# Patient Record
Sex: Female | Born: 1975 | Race: White | Hispanic: No | Marital: Married | Smoking: Former smoker
Health system: Southern US, Community
[De-identification: ages and names within clinical notes are randomized; demographics above are authoritative.]

## PROBLEM LIST (undated history)

## (undated) DIAGNOSIS — I10 Essential (primary) hypertension: Secondary | ICD-10-CM

## (undated) DIAGNOSIS — E785 Hyperlipidemia, unspecified: Secondary | ICD-10-CM

## (undated) HISTORY — DX: Essential (primary) hypertension: I10

## (undated) HISTORY — DX: Hyperlipidemia, unspecified: E78.5

---

## 2005-01-04 DIAGNOSIS — C801 Malignant (primary) neoplasm, unspecified: Secondary | ICD-10-CM

## 2005-01-04 HISTORY — DX: Malignant (primary) neoplasm, unspecified: C80.1

## 2008-04-24 ENCOUNTER — Ambulatory Visit (HOSPITAL_COMMUNITY): Admission: RE | Admit: 2008-04-24 | Discharge: 2008-04-24 | Payer: Self-pay | Admitting: Obstetrics and Gynecology

## 2008-04-25 ENCOUNTER — Encounter (INDEPENDENT_AMBULATORY_CARE_PROVIDER_SITE_OTHER): Payer: Self-pay | Admitting: Obstetrics and Gynecology

## 2009-01-29 ENCOUNTER — Inpatient Hospital Stay (HOSPITAL_COMMUNITY): Admission: AD | Admit: 2009-01-29 | Discharge: 2009-01-29 | Payer: Self-pay | Admitting: Obstetrics and Gynecology

## 2009-01-31 ENCOUNTER — Inpatient Hospital Stay (HOSPITAL_COMMUNITY): Admission: AD | Admit: 2009-01-31 | Discharge: 2009-01-31 | Payer: Self-pay | Admitting: Obstetrics and Gynecology

## 2009-02-02 ENCOUNTER — Inpatient Hospital Stay (HOSPITAL_COMMUNITY): Admission: AD | Admit: 2009-02-02 | Discharge: 2009-02-02 | Payer: Self-pay | Admitting: Obstetrics & Gynecology

## 2009-02-04 ENCOUNTER — Inpatient Hospital Stay (HOSPITAL_COMMUNITY): Admission: AD | Admit: 2009-02-04 | Discharge: 2009-02-09 | Payer: Self-pay | Admitting: Obstetrics & Gynecology

## 2009-02-04 ENCOUNTER — Encounter (INDEPENDENT_AMBULATORY_CARE_PROVIDER_SITE_OTHER): Payer: Self-pay | Admitting: Obstetrics & Gynecology

## 2009-02-10 ENCOUNTER — Encounter: Admission: RE | Admit: 2009-02-10 | Discharge: 2009-03-08 | Payer: Self-pay | Admitting: Obstetrics and Gynecology

## 2009-02-22 ENCOUNTER — Inpatient Hospital Stay (HOSPITAL_COMMUNITY): Admission: AD | Admit: 2009-02-22 | Discharge: 2009-02-22 | Payer: Self-pay | Admitting: Obstetrics and Gynecology

## 2009-02-23 ENCOUNTER — Inpatient Hospital Stay (HOSPITAL_COMMUNITY): Admission: AD | Admit: 2009-02-23 | Discharge: 2009-02-23 | Payer: Self-pay | Admitting: Obstetrics and Gynecology

## 2009-02-24 ENCOUNTER — Inpatient Hospital Stay (HOSPITAL_COMMUNITY): Admission: AD | Admit: 2009-02-24 | Discharge: 2009-02-24 | Payer: Self-pay | Admitting: Obstetrics and Gynecology

## 2009-02-25 ENCOUNTER — Inpatient Hospital Stay (HOSPITAL_COMMUNITY): Admission: AD | Admit: 2009-02-25 | Discharge: 2009-02-25 | Payer: Self-pay | Admitting: Obstetrics and Gynecology

## 2009-03-01 ENCOUNTER — Inpatient Hospital Stay (HOSPITAL_COMMUNITY): Admission: AD | Admit: 2009-03-01 | Discharge: 2009-03-01 | Payer: Self-pay | Admitting: Obstetrics and Gynecology

## 2009-03-02 ENCOUNTER — Ambulatory Visit: Payer: Self-pay | Admitting: Obstetrics and Gynecology

## 2009-03-02 ENCOUNTER — Inpatient Hospital Stay (HOSPITAL_COMMUNITY): Admission: AD | Admit: 2009-03-02 | Discharge: 2009-03-02 | Payer: Self-pay | Admitting: Obstetrics and Gynecology

## 2009-03-03 ENCOUNTER — Inpatient Hospital Stay (HOSPITAL_COMMUNITY): Admission: AD | Admit: 2009-03-03 | Discharge: 2009-03-03 | Payer: Self-pay | Admitting: Obstetrics and Gynecology

## 2009-10-22 ENCOUNTER — Ambulatory Visit (HOSPITAL_COMMUNITY): Admission: RE | Admit: 2009-10-22 | Discharge: 2009-10-22 | Payer: Self-pay | Admitting: Obstetrics and Gynecology

## 2010-01-03 ENCOUNTER — Inpatient Hospital Stay (HOSPITAL_COMMUNITY): Admission: AD | Admit: 2010-01-03 | Discharge: 2010-01-07 | Payer: Self-pay | Admitting: General Surgery

## 2011-02-16 LAB — DIFFERENTIAL
Basophils Absolute: 0.1 10*3/uL (ref 0.0–0.1)
Eosinophils Relative: 2 % (ref 0–5)
Lymphocytes Relative: 31 % (ref 12–46)
Lymphs Abs: 1.6 10*3/uL (ref 0.7–4.0)
Monocytes Absolute: 0.3 10*3/uL (ref 0.1–1.0)
Monocytes Relative: 7 % (ref 3–12)

## 2011-02-16 LAB — COMPREHENSIVE METABOLIC PANEL
AST: 22 U/L (ref 0–37)
Albumin: 4 g/dL (ref 3.5–5.2)
Chloride: 103 mEq/L (ref 96–112)
Creatinine, Ser: 0.94 mg/dL (ref 0.4–1.2)
GFR calc Af Amer: >60 mL/min (ref >60)
Total Bilirubin: 1.5 mg/dL — ABNORMAL HIGH (ref 0.3–1.2)
Total Protein: 7 g/dL (ref 6.0–8.3)

## 2011-02-16 LAB — CBC
MCV: 94 fL (ref 78.0–100.0)
Platelets: 263 10*3/uL (ref 150–400)
RDW: 12.9 % (ref 11.5–15.5)
WBC: 5.2 10*3/uL (ref 4.0–10.5)

## 2011-02-16 LAB — URINALYSIS, ROUTINE W REFLEX MICROSCOPIC
Bilirubin Urine: NEGATIVE
Glucose, UA: NEGATIVE mg/dL
Hgb urine dipstick: NEGATIVE
Specific Gravity, Urine: 1.029 (ref 1.005–1.030)
pH: 6 (ref 5.0–8.0)

## 2011-03-12 LAB — CBC
HCT: 29.9 % — ABNORMAL LOW (ref 36.0–46.0)
HCT: 32.6 % — ABNORMAL LOW (ref 36.0–46.0)
HCT: 33.9 % — ABNORMAL LOW (ref 36.0–46.0)
HCT: 37.4 % (ref 36.0–46.0)
Hemoglobin: 10 g/dL — ABNORMAL LOW (ref 12.0–15.0)
Hemoglobin: 11.3 g/dL — ABNORMAL LOW (ref 12.0–15.0)
Hemoglobin: 11.4 g/dL — ABNORMAL LOW (ref 12.0–15.0)
Hemoglobin: 12.6 g/dL (ref 12.0–15.0)
MCHC: 33.4 g/dL (ref 30.0–36.0)
MCHC: 33.5 g/dL (ref 30.0–36.0)
MCHC: 33.7 g/dL (ref 30.0–36.0)
MCHC: 33.8 g/dL (ref 30.0–36.0)
MCV: 91.5 fL (ref 78.0–100.0)
MCV: 91.7 fL (ref 78.0–100.0)
MCV: 91.8 fL (ref 78.0–100.0)
MCV: 92.2 fL (ref 78.0–100.0)
MCV: 92.4 fL (ref 78.0–100.0)
MCV: 92.6 fL (ref 78.0–100.0)
MCV: 92.7 fL (ref 78.0–100.0)
Platelets: 135 10*3/uL — ABNORMAL LOW (ref 150–400)
Platelets: 144 10*3/uL — ABNORMAL LOW (ref 150–400)
Platelets: 157 10*3/uL (ref 150–400)
RBC: 3.14 MIL/uL — ABNORMAL LOW (ref 3.87–5.11)
RBC: 3.23 MIL/uL — ABNORMAL LOW (ref 3.87–5.11)
RBC: 3.65 MIL/uL — ABNORMAL LOW (ref 3.87–5.11)
RBC: 3.69 MIL/uL — ABNORMAL LOW (ref 3.87–5.11)
RBC: 3.81 MIL/uL — ABNORMAL LOW (ref 3.87–5.11)
RDW: 14.4 % (ref 11.5–15.5)
RDW: 14.5 % (ref 11.5–15.5)
RDW: 14.6 % (ref 11.5–15.5)
RDW: 14.8 % (ref 11.5–15.5)
WBC: 10.3 10*3/uL (ref 4.0–10.5)
WBC: 7.1 10*3/uL (ref 4.0–10.5)
WBC: 7.4 10*3/uL (ref 4.0–10.5)
WBC: 7.9 10*3/uL (ref 4.0–10.5)
WBC: 8 10*3/uL (ref 4.0–10.5)
WBC: 8.8 10*3/uL (ref 4.0–10.5)

## 2011-03-12 LAB — COMPREHENSIVE METABOLIC PANEL
ALT: 101 U/L — ABNORMAL HIGH (ref 0–35)
ALT: 17 U/L (ref 0–35)
ALT: 19 U/L (ref 0–35)
AST: 23 U/L (ref 0–37)
AST: 24 U/L (ref 0–37)
AST: 25 U/L (ref 0–37)
AST: 29 U/L (ref 0–37)
AST: 82 U/L — ABNORMAL HIGH (ref 0–37)
Albumin: 2 g/dL — ABNORMAL LOW (ref 3.5–5.2)
Albumin: 2.4 g/dL — ABNORMAL LOW (ref 3.5–5.2)
Alkaline Phosphatase: 65 U/L (ref 39–117)
BUN: 14 mg/dL (ref 6–23)
BUN: 9 mg/dL (ref 6–23)
BUN: 9 mg/dL (ref 6–23)
CO2: 22 mEq/L (ref 19–32)
CO2: 22 mEq/L (ref 19–32)
CO2: 22 mEq/L (ref 19–32)
CO2: 24 mEq/L (ref 19–32)
CO2: 25 mEq/L (ref 19–32)
Calcium: 8 mg/dL — ABNORMAL LOW (ref 8.4–10.5)
Calcium: 8.8 mg/dL (ref 8.4–10.5)
Calcium: 8.9 mg/dL (ref 8.4–10.5)
Calcium: 9.1 mg/dL (ref 8.4–10.5)
Calcium: 9.3 mg/dL (ref 8.4–10.5)
Chloride: 102 mEq/L (ref 96–112)
Chloride: 103 mEq/L (ref 96–112)
Chloride: 105 mEq/L (ref 96–112)
Chloride: 105 mEq/L (ref 96–112)
Chloride: 105 mEq/L (ref 96–112)
Creatinine, Ser: 0.76 mg/dL (ref 0.4–1.2)
Creatinine, Ser: 0.78 mg/dL (ref 0.4–1.2)
Creatinine, Ser: 0.83 mg/dL (ref 0.4–1.2)
Creatinine, Ser: 0.83 mg/dL (ref 0.4–1.2)
Creatinine, Ser: 0.84 mg/dL (ref 0.4–1.2)
Creatinine, Ser: 0.88 mg/dL (ref 0.4–1.2)
GFR calc Af Amer: >60 mL/min (ref >60)
GFR calc Af Amer: >60 mL/min (ref >60)
GFR calc Af Amer: >60 mL/min (ref >60)
GFR calc Af Amer: >60 mL/min (ref >60)
GFR calc non Af Amer: >60 mL/min (ref >60)
GFR calc non Af Amer: >60 mL/min (ref >60)
GFR calc non Af Amer: >60 mL/min (ref >60)
GFR calc non Af Amer: >60 mL/min (ref >60)
GFR calc non Af Amer: >60 mL/min (ref >60)
Glucose, Bld: 116 mg/dL — ABNORMAL HIGH (ref 70–99)
Glucose, Bld: 69 mg/dL — ABNORMAL LOW (ref 70–99)
Glucose, Bld: 93 mg/dL (ref 70–99)
Glucose, Bld: 93 mg/dL (ref 70–99)
Potassium: 4.3 mEq/L (ref 3.5–5.1)
Sodium: 133 mEq/L — ABNORMAL LOW (ref 135–145)
Sodium: 138 mEq/L (ref 135–145)
Total Bilirubin: 0.4 mg/dL (ref 0.3–1.2)
Total Bilirubin: 0.5 mg/dL (ref 0.3–1.2)
Total Bilirubin: 0.6 mg/dL (ref 0.3–1.2)
Total Bilirubin: 0.6 mg/dL (ref 0.3–1.2)
Total Bilirubin: 0.8 mg/dL (ref 0.3–1.2)
Total Protein: 4.4 g/dL — ABNORMAL LOW (ref 6.0–8.3)
Total Protein: 4.9 g/dL — ABNORMAL LOW (ref 6.0–8.3)
Total Protein: 5.4 g/dL — ABNORMAL LOW (ref 6.0–8.3)

## 2011-03-12 LAB — URINALYSIS, ROUTINE W REFLEX MICROSCOPIC
Glucose, UA: NEGATIVE mg/dL
Glucose, UA: NEGATIVE mg/dL
Hgb urine dipstick: NEGATIVE
Hgb urine dipstick: NEGATIVE
Ketones, ur: NEGATIVE mg/dL
Leukocytes, UA: NEGATIVE
Leukocytes, UA: NEGATIVE
Nitrite: NEGATIVE
Protein, ur: 100 mg/dL — AB
Protein, ur: 100 mg/dL — AB
Specific Gravity, Urine: 1.025 (ref 1.005–1.030)
Urobilinogen, UA: 0.2 mg/dL (ref 0.0–1.0)
Urobilinogen, UA: 0.2 mg/dL (ref 0.0–1.0)
pH: 6.5 (ref 5.0–8.0)

## 2011-03-12 LAB — URINE MICROSCOPIC-ADD ON

## 2011-03-12 LAB — LACTATE DEHYDROGENASE
LDH: 132 U/L (ref 94–250)
LDH: 165 U/L (ref 94–250)
LDH: 172 U/L (ref 94–250)
LDH: 178 U/L (ref 94–250)
LDH: 195 U/L (ref 94–250)
LDH: 217 U/L (ref 94–250)

## 2011-03-12 LAB — GLUCOSE, CAPILLARY: Glucose-Capillary: 94 mg/dL (ref 70–99)

## 2011-03-12 LAB — RPR: RPR Ser Ql: NONREACTIVE

## 2011-03-12 LAB — URIC ACID
Uric Acid, Serum: 6.2 mg/dL (ref 2.4–7.0)
Uric Acid, Serum: 6.5 mg/dL (ref 2.4–7.0)

## 2011-04-14 NOTE — Discharge Summary (Signed)
NAMEMARNY, SMETHERS                ACCOUNT NO.:  [id number]   MEDICAL RECORD NO.:  [id number]          PATIENT TYPE:  INP   LOCATION:  9306                          FACILITY:  [facility]   PHYSICIAN:  Guy Sandifer. Henderson Cloud, M.D. DATE OF BIRTH:  February 05, 1976   DATE OF ADMISSION:  02/04/2009  DATE OF DISCHARGE:  02/09/2009                               DISCHARGE SUMMARY   ADMITTING DIAGNOSES:  1. Anterior pregnancy at 82 and one-half weeks' estimated gestational      age.  2. Chronic hypertension.  3. Eclampsia.   DISCHARGE DIAGNOSES:  1. Anterior pregnancy at 50 and one-half weeks' estimated gestational      age.  2. Chronic hypertension.  3. Eclampsia.   PROCEDURE:  On February 04, 2009, repeat cesarean section.   REASON FOR ADMISSION:  This patient is a 35 year old white female G3, P1  at 25 and one-half weeks, whose prenatal care has been complicated by  hypertension.  She is on labetalol and Aldomet.  She had been seen in a  hospital in [place] on January 28, 2009, when she was admitted for blood  pressure management.  She was then seen in our office on January 29, 2009,  where she had laboratories done, BPP and nonstress test in the MAU,  which was normal.  She presented to [facility] on February 03, 2009,  with a complaint of elevated blood pressure.  Labs again were normal and  fetal heart tones were reactive.  She was sent home for followup in the  office this week.  She presented again on the morning of admission  complaining of headache and elevated blood pressure.   Laboratory work up was essentially normal and blood pressure improved  with rest.  She then developed tonic-clonic seizure activity.   HOSPITAL COURSE:  The patient was admitted to the hospital.  She was  treated with intravenous labetalol for blood pressures.  She was given  10 mg of Valium IV and was given magnesium sulfate intravenously.  She  was postictal, but stable.  She underwent head CT, which was negative  for  CVA.  She was then taken to operating room, where she underwent  repeat cesarean section, delivery of a viable female infant, Apgars of 8  and 9 with pH of 7.26.  Postoperatively, she was continued on magnesium  sulfate.  Laboratories remained okay.  Blood pressures were 160s/100s-  90s.  She was continued on her blood pressure medicines.  Her Aldomet  was increased from 250 mg t.i.d. to 500 mg t.i.d..  Her labetalol was  increased from 200 mg t.i.d. to 300 mg t.i.d.  On February 06, 2009, she  was given intravenous Lasix to help with diuresis for edema.  Magnesium  sulfate was discontinued on February 05, 2009.  She had continued to be  observed for stability as well as for blood pressure control.  On the  day of discharge, she is feeling good.  Blood pressures were improved in  the 130s-140s/80s.  Her vertical incision is healing well.  Staples  remain in place.  She is discharged  home in good condition.  She will  call for problems including not limited to temperature of 101 degrees,  increasing pain, persistent nausea, vomiting, heavy bleeding, headache,  or blurry vision.   MEDICATIONS:  1. Labetalol 300 mg, #90, one p.o. t.i.d. two refills.  2. Aldomet 500 mg, #90, one p.o. t.i.d. two refills.  3. Percocet 5/325, #40, one to two p.o. q.6 h. p.r.n.  4. Ibuprofen 600 mg q.6 h. p.r.n.  5. Vitamin daily.   Follow up is in the office in 2-4 days.       Guy Sandifer Henderson Cloud, M.D.  Electronically Signed     JET/MEDQ  D:  02/09/2009  T:  02/09/2009  Job:  [id number]

## 2011-04-14 NOTE — Op Note (Signed)
NAMEMarland Moss  KEARI, MIU                ACCOUNT NO.:  [id number]   MEDICAL RECORD NO.:  [id number]          PATIENT TYPE:  INP   LOCATION:  9371                          [facility]ACILITY:  [facility]   PHYSICIAN:  Freddy Finner, M.D.   DATE OF BIRTH:  Nov 29, 1976   DATE OF PROCEDURE:  02/04/2009  DATE OF DISCHARGE:                               OPERATIVE REPORT   PREOPERATIVE DIAGNOSIS:  Intrauterine pregnancy at 32-1/[redacted] weeks  gestation, eclampsia.   POSTOPERATIVE DIAGNOSIS:  Intrauterine pregnancy at 32-1/[redacted] weeks  gestation, eclampsia.   OPERATION:  Repeat cesarean section.   SURGEON:  Freddy Finner, M.D.   ANESTHESIA:  Spinal.   ESTIMATED BLOOD LOSS:  Intraoperatively 600 cc.   COMPLICATIONS:  Intraoperatively none.   SECONDARY DIAGNOSES:  Extensive scarring of the abdominal wall and  omental adhesions.   FINDINGS:  Delivered viable female infant, Apgars of 8 and 9 assigned by  the NICU team in attendance.  Arterial cord pH 7.26.   The patient is a 35 year old gravida 3, para 1, whose first pregnancy  delivered in 2002.  The current pregnancy is by a new husband.  The  patient is known to have chronic hypertension with increasing  hypertension to pregnancy and gestational diabetes for which she takes  glyburide.  She has been seen for evaluation at least 3 times in the  last week and was recently started on labetalol in the office on January 29, 2009, for her hypertension.  On that day, she also had a pH  evaluation and non-stress test and biophysical profile, all of which  were normal.  She presented for the second time in the last 24 hours  early a.m., approximately 4 a.m., with complaints of increased blood  pressure and at this time was having a headache.  Numbers have been  obtained which were normal within the last 3 hours. Her pressure  improved in the lateral decubitus position.  She was given medications  for her headache and immediately after receiving that medication, had a  tonic-clonic seizure.  Dr. Shawnie Pons with the teaching service was in the  house and attended the patient until my arrival, starting her on  magnesium sulfate 2 grams IV, 2 grams per hour.  She had a second  seizure immediately prior to my arrival.  She was postictal at that time  but I did give her 10 mg of Valium IV.  She had a CT scan which revealed  encephalopathy but no stroke or intracerebral bleeding.  Her sensorium  proved and she was alert and cooperative.  It was elected to proceed  with cesarean delivery.   She was taken to the operating room and there placed under adequate  spinal anesthesia.  She was given 900 mg of clindamycin IV.  She was  placed in the dorsal recumbent position with elevation of the right hip  about 15 degrees.  Her abdomen was prepped and draped in the usual  fashion.  Because of her massive obesity, straps were applied to hold  the abdomen to the anesthesia screen. An old lower midline incision  was  used and the incision made through the old scar and carried sharply down  to the fascia.  The fascia was entered sharply and extended the extent  of the skin incision.  The peritoneum was entered with significant  omental adhesions which were lysed, allowing access to the uterus and to  the peritoneal cavity.  Some scarring of the peritoneum on the lower  segment was dissected free.  An incision was made above the bladder  reflection and extended vertically in the midline.  With careful blunt  and sharp dissection, the membrane was revealed, ruptured and the  incision was extended manually.  The infant was then delivered without  significant difficulty in a vertex presentation.  Apgars and pH were  noted above.  Cord blood was obtained for both venous and arterial  samples.  The uterus was delivered onto the anterior abdominal wall. The  placenta was removed and the uterus carefully manually explored to  confirm complete evacuation of parts of conception.  The  placenta was  submitted for pathologic examination.  The uterine incision was closed  basically vertically.  The small transverse portion of the incision was  included in the vertical closure.  This was done in two layers with a  running locking 0 Monocryl for the first layer and a running locking 0  Monocryl for the second layer.  A figure-of-8 was required at the  midportion of the incision on the left side for complete hemostasis.  The uterus was delivered back into the abdominal cavity.  Irrigation was  carried out.  Hemostasis was complete.  Pack, needle and instruments  counts were correct.  The abdominal incision was closed in layers,  looped 0 PDS was used to close the fascia, running from superiorly to  inferiorly.  Subcutaneous tissue was approximated with interrupted 2-0  plains, skin was closed with skin staples.  The patient tolerated the  operative procedure well and was taken to the recovery room in good  condition.      Freddy Finner, M.D.  Electronically Signed     WRN/MEDQ  D:  02/04/2009  T:  02/04/2009  Job:  [id number]

## 2011-04-14 NOTE — Op Note (Signed)
Annette Moss, Annette Moss                ACCOUNT NO.:  [id number]   MEDICAL RECORD NO.:  [id number]          PATIENT TYPE:  AMB   LOCATION:  SDC                           [facility]:  [facility]   PHYSICIAN:  Michelle L. Grewal, M.D.DATE OF BIRTH:  07/14/1976   DATE OF PROCEDURE:  04/24/2008  DATE OF DISCHARGE:                               OPERATIVE REPORT   PREOPERATIVE DIAGNOSIS:  Incomplete abortion.   POSTOPERATIVE DIAGNOSIS:  Incomplete abortion.   PROCEDURE:  Dilatation and evacuation.   SURGEON:  Michelle L. Vincente Poli, MD   ANESTHESIA:  MAC with local.   SPECIMENS:  Products of conception, sent to pathology.   ESTIMATED BLOOD LOSS:  Less than 50 mL.   COMPLICATIONS:  None.   PROCEDURE:  The patient was taken to the operating room, after informed  consent was obtained.  She was prepped and draped in sterile fashion.  A  speculum was placed in the vagina.  The cervix was grasped with a  tenaculum.  Her cervix was already partially opened and there was a  moderate amount of blood in the vagina.  A paracervical block was  performed in standard fashion.  The cervix was dilated a little bit  further with a Pratt dilator, and a #7 suction cannula was easily  inserted to the uterus and the uterus was thoroughly suctioned x2 with  products of conception retrieved.  The cannula was removed.  A sharp  curette was inserted and the uterus was thoroughly curetted of all  tissue and the tissue did appear to be consistent with products of  conception.  A final suction curettage was performed, and the uterine  cavity was cleaned.  All instruments were removed from the vagina.  All  sponge, lap, and instrument counts were correct x2.  The patient went to  recovery room in stable condition.      Michelle L. Vincente Poli, M.D.  Electronically Signed     MLG/MEDQ  D:  04/24/2008  T:  04/25/2008  Job:  [id number]

## 2011-08-26 LAB — CBC
HCT: 37.3
MCHC: 34.5
MCV: 91.8
RBC: 4.06
WBC: 7.9

## 2011-08-30 IMAGING — CT CT ABDOMEN W/ CM
1 of 2 series · 15 of 32 positions shown, 19 images · IV contrast (agent unspecified)
Comparison: None

CT ABDOMEN

CLINICAL DATA: Right lower quadrant abdominal mass.  Evaluate for
mass or hernia.  Previous cesarean sections and left oophrectomy.

CT ABDOMEN AND PELVIS WITH CONTRAST
TECHNIQUE: Multidetector CT imaging of the abdomen and pelvis was
performed using the standard protocol following bolus
administration of intravenous contrast.
Contrast: 100 ml Rmnipaque-F44 and oral contrast

[Series 2: routine abdomen/pelvis with · axial · 0.98mm/px · z∈[-486,-30]mm · 15 of 101 slices shown, 19 images]
[im 5/101  soft-tissue]
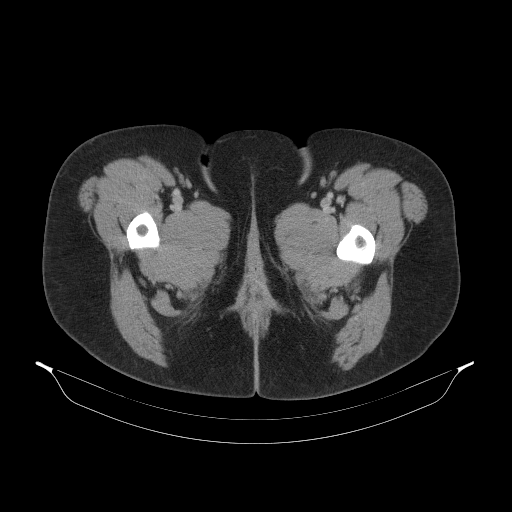
[im 5/101  bone]
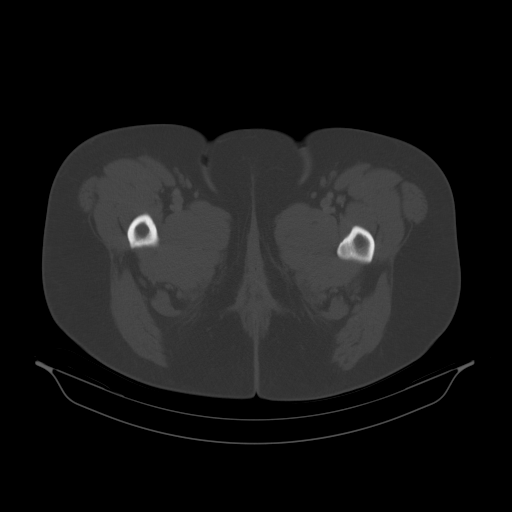
[im 13/101  soft-tissue]
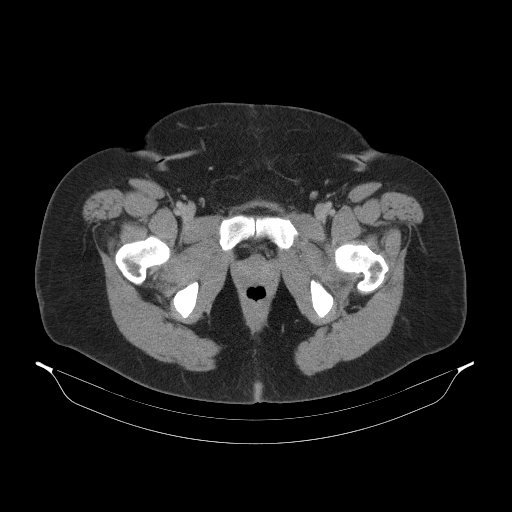
[im 21/101  soft-tissue]
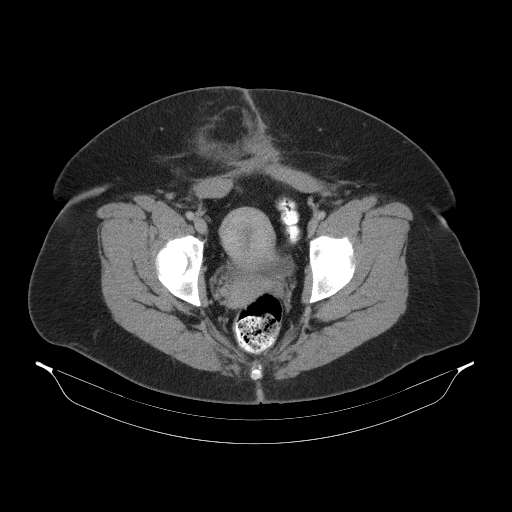
[im 30/101  soft-tissue]
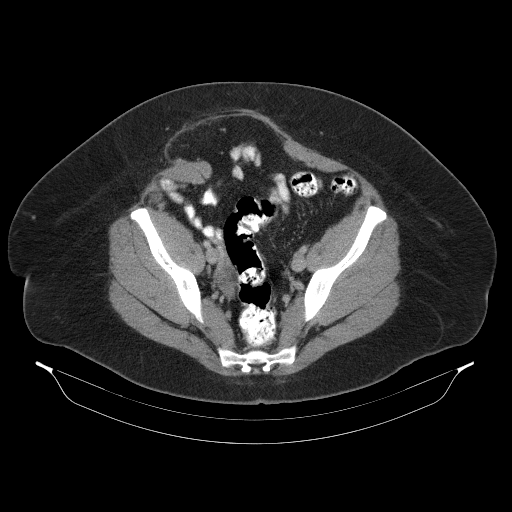
[im 34/101  soft-tissue]
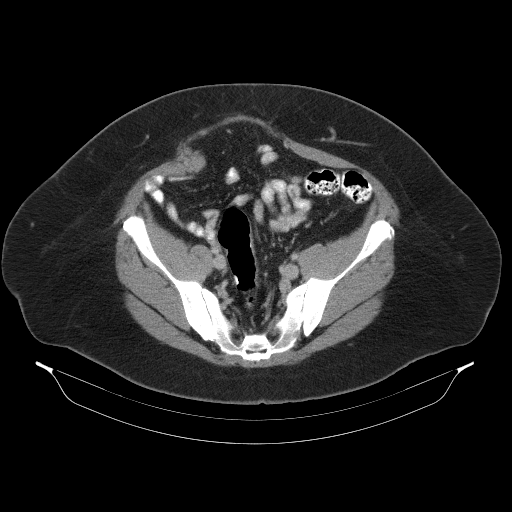
[im 42/101  soft-tissue]
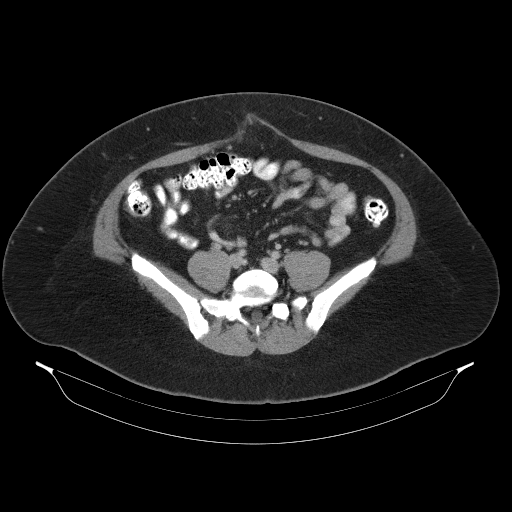
[im 51/101  soft-tissue]
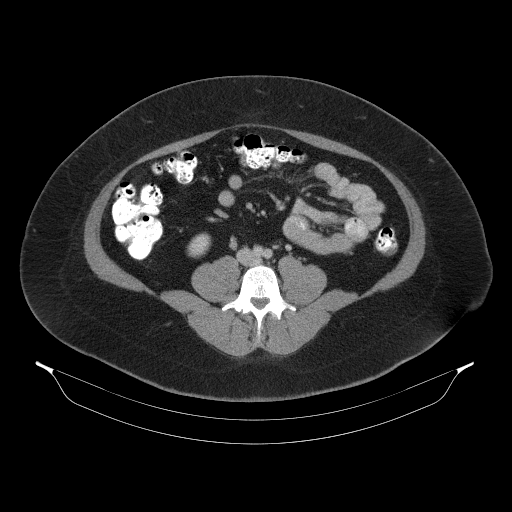
[im 59/101  soft-tissue]
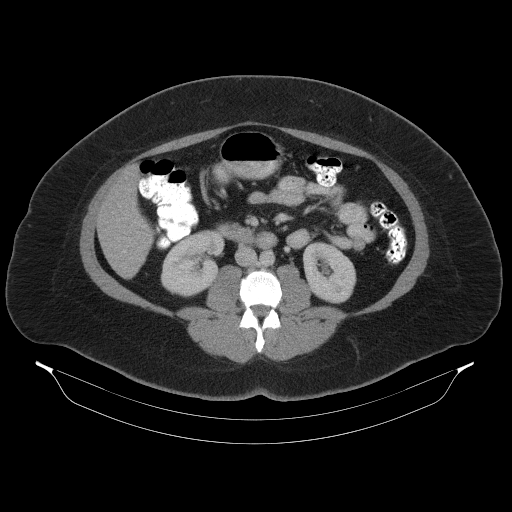
[im 67/101  soft-tissue]
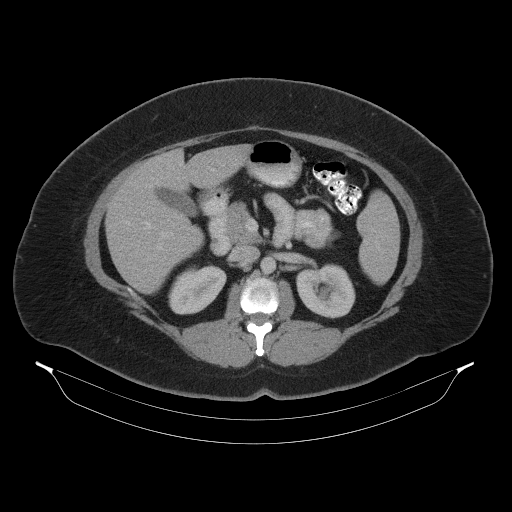
[im 67/101  bone]
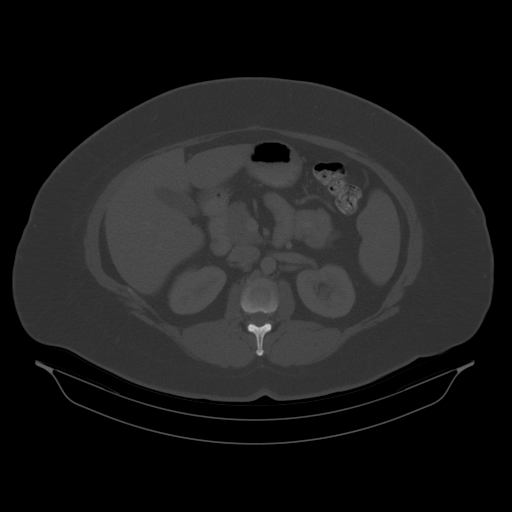
[im 71/101  soft-tissue]
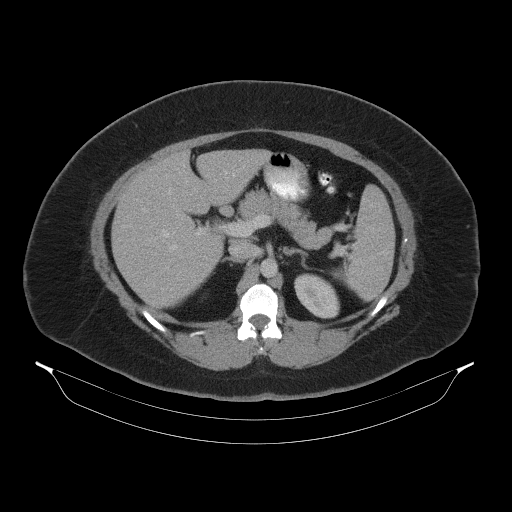
[im 80/101  soft-tissue]
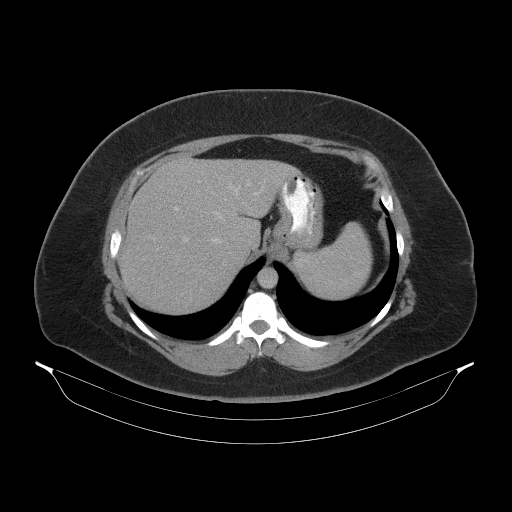
[im 84/101  lung]
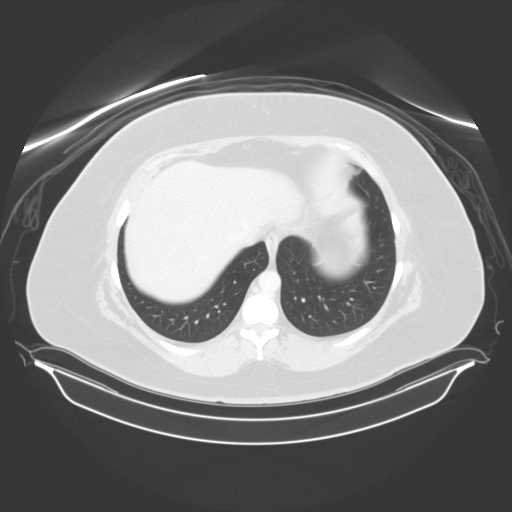
[im 88/101  soft-tissue]
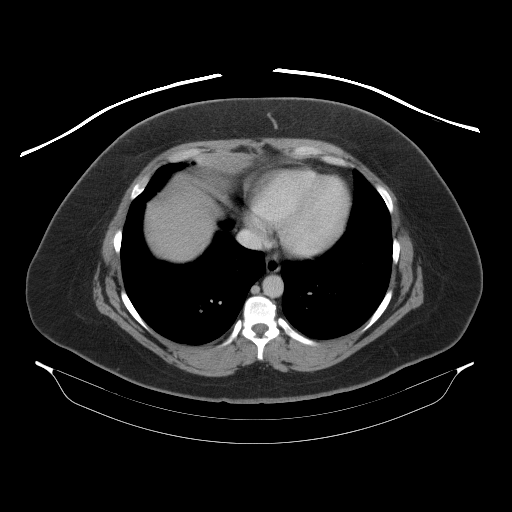
[im 88/101  lung]
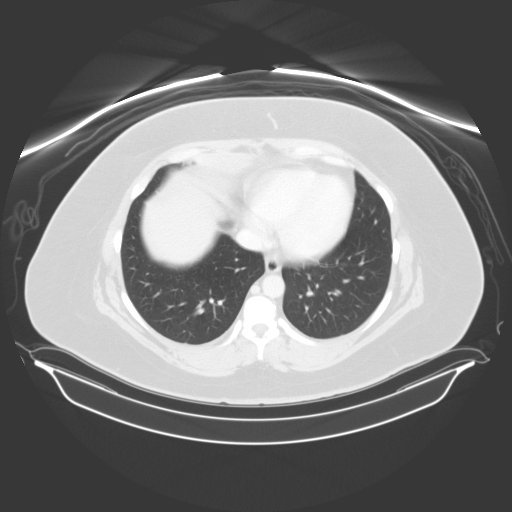
[im 92/101  lung]
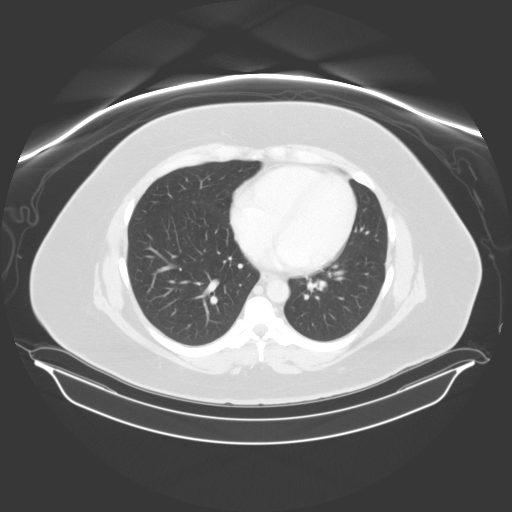
[im 96/101  soft-tissue]
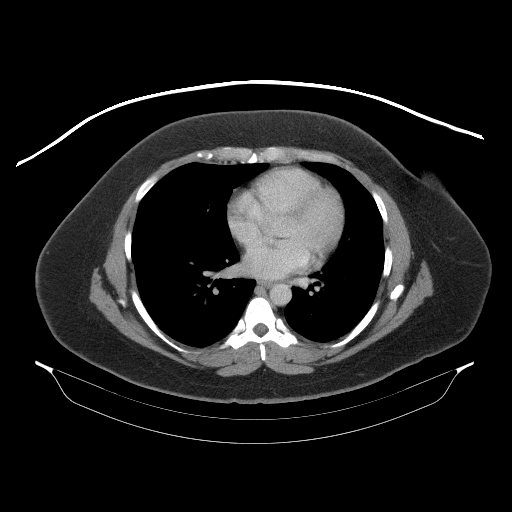
[im 96/101  lung]
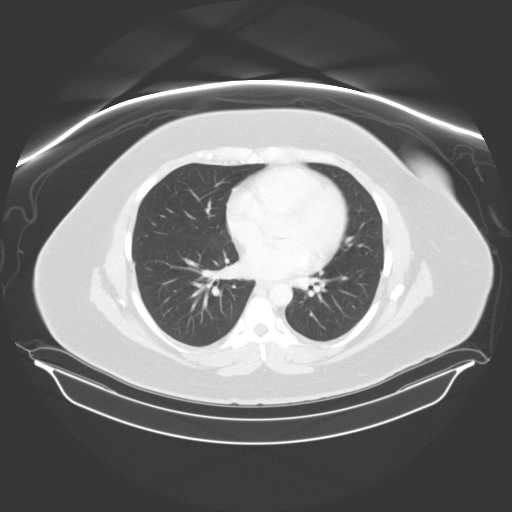

[15 of 32 positions shown; findings below may reference images not displayed]

FINDINGS: The abdominal parenchymal organs are normal in
appearance.  No soft tissue masses or adenopathy identified.

There is no evidence of inflammatory process or abnormal fluid
collections.  Gallbladder is unremarkable appearance there is no
evidence of hydronephrosis.  Abdominal bowel loops are also normal
in appearance.  No abdominal wall mass identified.
IMPRESSION: Negative abdomen CT.  No evidence of mass or hernia and upper
abdomen.

CT PELVIS
FINDINGS: A small periumbilical intra-abdominal wall hernia is
seen containing only omental fat.

There is a second midline infraumbilical abdominal wall hernia
containing a large amount of fat and a single loop of small bowel.
There is no evidence of bowel ischemia or obstruction.

Uterus and adnexa are unremarkable appearance.  No pelvic soft
tissue masses or adenopathy identified.  There is no evidence of
inflammatory process or abnormal fluid collections.
IMPRESSION: 1.  Large midline infraumbilical abdominal wall hernia containing
mostly fat and a single loop of small bowel.
2.  Small periumbilical abdominal wall hernia containing only
omental fat.
3.  No evidence of pelvic soft tissue mass or other significant
abnormality.

## 2012-04-19 ENCOUNTER — Other Ambulatory Visit: Payer: Self-pay | Admitting: Obstetrics and Gynecology

## 2013-04-20 ENCOUNTER — Other Ambulatory Visit: Payer: Self-pay | Admitting: Obstetrics and Gynecology

## 2014-06-08 ENCOUNTER — Other Ambulatory Visit: Payer: Self-pay | Admitting: Obstetrics and Gynecology

## 2014-06-11 LAB — CYTOLOGY - PAP

## 2015-06-26 ENCOUNTER — Other Ambulatory Visit: Payer: Self-pay | Admitting: Obstetrics and Gynecology

## 2015-06-28 LAB — CYTOLOGY - PAP

## 2021-11-07 ENCOUNTER — Other Ambulatory Visit: Payer: Self-pay | Admitting: Surgery

## 2021-11-12 ENCOUNTER — Other Ambulatory Visit: Payer: Self-pay | Admitting: Surgery

## 2021-11-12 DIAGNOSIS — K439 Ventral hernia without obstruction or gangrene: Secondary | ICD-10-CM

## 2021-12-09 ENCOUNTER — Other Ambulatory Visit: Payer: Self-pay

## 2021-12-09 ENCOUNTER — Ambulatory Visit
Admission: RE | Admit: 2021-12-09 | Discharge: 2021-12-09 | Disposition: A | Discharge status: Home / Self Care | Payer: BC Managed Care – PPO | Source: Ambulatory Visit | Attending: Surgery | Admitting: Surgery

## 2021-12-09 DIAGNOSIS — K439 Ventral hernia without obstruction or gangrene: Secondary | ICD-10-CM

## 2021-12-09 MED ORDER — IOPAMIDOL (ISOVUE-300) INJECTION 61%
100.0000 mL | Freq: Once | INTRAVENOUS | Status: AC | PRN
  Administered 2021-12-09: 100 mL via INTRAVENOUS

## 2022-07-14 ENCOUNTER — Encounter: Payer: Self-pay | Admitting: Plastic Surgery

## 2022-07-14 ENCOUNTER — Ambulatory Visit: Payer: BC Managed Care – PPO | Admitting: Plastic Surgery

## 2022-07-14 DIAGNOSIS — M546 Pain in thoracic spine: Secondary | ICD-10-CM | POA: Diagnosis not present

## 2022-07-14 DIAGNOSIS — M793 Panniculitis, unspecified: Secondary | ICD-10-CM

## 2022-07-14 DIAGNOSIS — F17210 Nicotine dependence, cigarettes, uncomplicated: Secondary | ICD-10-CM

## 2022-07-14 DIAGNOSIS — Z6839 Body mass index (BMI) 39.0-39.9, adult: Secondary | ICD-10-CM

## 2022-07-14 DIAGNOSIS — G8929 Other chronic pain: Secondary | ICD-10-CM

## 2022-07-14 DIAGNOSIS — M549 Dorsalgia, unspecified: Secondary | ICD-10-CM | POA: Insufficient documentation

## 2022-07-14 NOTE — Addendum Note (Signed)
Addended by: Sherol Dade on: 07/14/2022 09:47 AM   Modules accepted: Orders

## 2022-07-14 NOTE — Addendum Note (Signed)
Addended by: Sherol Dade on: 07/14/2022 03:54 PM   Modules accepted: Orders

## 2022-07-14 NOTE — Progress Notes (Signed)
Patient ID: Annette Moss, female    DOB: 11/28/1976, 46 y.o.   MRN: [id number]   Chief Complaint  Patient presents with   Consult         The patient is a 46 year old female here for evaluation of her abdomen.  She is 5 feet 3 inches tall and weighs 225 pounds.  Her body mass index is 39.9 kg/m.  She has lost 25 pounds in the last several months.  She had her hemoglobin A1c checked a year ago and was 5.9.  She has a history of ovarian cancer in 2006.  She had a hernia repair on the right abdominal area by Dr. Johna Sheriff in 2011.  They did use mesh at the time.  She also had 2 C-sections and is planning on a hysterectomy with Dr. Milton Ferguson.  She is smoking but does not have diabetes.  She complains of back pain and difficulty finding close due to her pannus.  I do not feel any hernia today.  Her pannus is pendulous.    Review of Systems  Constitutional: Negative.   Eyes: Negative.   Respiratory: Negative.    Cardiovascular: Negative.   Gastrointestinal: Negative.   Endocrine: Negative.   Genitourinary: Negative.   Musculoskeletal:  Positive for back pain.  Skin: Negative.   Hematological: Negative.   Psychiatric/Behavioral: Negative.      History reviewed. No pertinent past medical history.  History reviewed. No pertinent surgical history.    Current Outpatient Medications:    amphetamine-dextroamphetamine (ADDERALL) 10 MG tablet, Take 1 tablet by mouth 2 (two) times daily., Disp: , Rfl:    lisinopril (ZESTRIL) 10 MG tablet, Take 10 mg by mouth daily., Disp: , Rfl:    Objective:   Vitals:   07/14/22 0912  BP: 127/85  Pulse: 90  SpO2: 96%    Physical Exam Vitals and nursing note reviewed.  Constitutional:      Appearance: Normal appearance.  HENT:     Head: Normocephalic and atraumatic.  Cardiovascular:     Rate and Rhythm: Normal rate.     Pulses: Normal pulses.  Pulmonary:     Effort: Pulmonary effort is normal.  Abdominal:     General: There is no  distension.     Palpations: Abdomen is soft. There is no mass.     Tenderness: There is no abdominal tenderness.  Musculoskeletal:        General: No swelling or deformity.  Skin:    General: Skin is warm.     Capillary Refill: Capillary refill takes less than 2 seconds.     Coloration: Skin is not jaundiced.     Findings: No bruising or lesion.  Neurological:     Mental Status: She is alert and oriented to person, place, and time.  Psychiatric:        Mood and Affect: Mood normal.        Behavior: Behavior normal.        Thought Content: Thought content normal.        Judgment: Judgment normal.     Assessment & Plan:  Morbid obesity (HCC) - Plan: Amb Ref to Medical Weight Management  Chronic bilateral thoracic back pain - Plan: Amb Ref to Medical Weight Management  Panniculitis  Recommend referral to general surgery for the possibility of gastric bypass.  Recommend referral to healthy weight and wellness.  She must be 3 months minimum tobacco free in order to get a panniculectomy or abdominoplasty.  She  is a good candidate for panniculectomy but she needs to decrease her weight.  I do not think that I could deliver the results she wants with her current abdominal girth.  I think she should go ahead with the hysterectomy and then work on these other things to have a better long-term result after surgery on her abdomen.  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  She will let us know if she has any questions and will contact us at a later time.  Pictures were obtained of the patient and placed in the chart with the patient's or guardian's permission.   Alena Bills Alessio Bogan, DO

## 2023-08-04 ENCOUNTER — Other Ambulatory Visit (HOSPITAL_BASED_OUTPATIENT_CLINIC_OR_DEPARTMENT_OTHER): Payer: Self-pay

## 2023-08-04 ENCOUNTER — Other Ambulatory Visit (HOSPITAL_COMMUNITY): Payer: Self-pay

## 2023-08-04 MED ORDER — WEGOVY 0.25 MG/0.5ML ~~LOC~~ SOAJ
0.2500 mg | SUBCUTANEOUS | 1 refills | Status: DC
  Filled 2023-08-04: qty 2, 28d supply, fill #0
  Filled 2023-08-25: qty 2, 28d supply, fill #1

## 2023-08-05 ENCOUNTER — Other Ambulatory Visit (HOSPITAL_BASED_OUTPATIENT_CLINIC_OR_DEPARTMENT_OTHER): Payer: Self-pay

## 2023-08-05 MED ORDER — SEMAGLUTIDE-WEIGHT MANAGEMENT 0.25 MG/0.5ML ~~LOC~~ SOAJ: 0.2500 mg | SUBCUTANEOUS | 0 refills | Status: AC

## 2023-08-25 ENCOUNTER — Encounter (HOSPITAL_BASED_OUTPATIENT_CLINIC_OR_DEPARTMENT_OTHER): Payer: Self-pay | Admitting: Emergency Medicine

## 2023-08-25 ENCOUNTER — Other Ambulatory Visit: Payer: Self-pay

## 2023-08-25 ENCOUNTER — Emergency Department (HOSPITAL_BASED_OUTPATIENT_CLINIC_OR_DEPARTMENT_OTHER): Payer: BC Managed Care – PPO

## 2023-08-25 ENCOUNTER — Emergency Department (HOSPITAL_BASED_OUTPATIENT_CLINIC_OR_DEPARTMENT_OTHER)
Admission: EM | Admit: 2023-08-25 | Discharge: 2023-08-25 | Disposition: A | Discharge status: Home / Self Care | Payer: BC Managed Care – PPO | Attending: Emergency Medicine | Admitting: Emergency Medicine

## 2023-08-25 ENCOUNTER — Other Ambulatory Visit (HOSPITAL_BASED_OUTPATIENT_CLINIC_OR_DEPARTMENT_OTHER): Payer: Self-pay

## 2023-08-25 DIAGNOSIS — I10 Essential (primary) hypertension: Secondary | ICD-10-CM | POA: Diagnosis not present

## 2023-08-25 DIAGNOSIS — R200 Anesthesia of skin: Secondary | ICD-10-CM

## 2023-08-25 DIAGNOSIS — Z79899 Other long term (current) drug therapy: Secondary | ICD-10-CM | POA: Diagnosis not present

## 2023-08-25 DIAGNOSIS — I219 Acute myocardial infarction, unspecified: Secondary | ICD-10-CM

## 2023-08-25 HISTORY — DX: Acute myocardial infarction, unspecified: I21.9

## 2023-08-25 LAB — CBC WITH DIFFERENTIAL/PLATELET
Abs Immature Granulocytes: 0.01 10*3/uL (ref 0.00–0.07)
Basophils Absolute: 0 10*3/uL (ref 0.0–0.1)
Basophils Relative: 0 %
Eosinophils Absolute: 0.4 10*3/uL (ref 0.0–0.5)
Eosinophils Relative: 6 %
HCT: 39.3 % (ref 36.0–46.0)
Hemoglobin: 13.4 g/dL (ref 12.0–15.0)
Immature Granulocytes: 0 %
Lymphocytes Relative: 30 %
Lymphs Abs: 1.9 10*3/uL (ref 0.7–4.0)
MCH: 31.2 pg (ref 26.0–34.0)
MCHC: 34.1 g/dL (ref 30.0–36.0)
MCV: 91.6 fL (ref 80.0–100.0)
Monocytes Absolute: 0.5 10*3/uL (ref 0.1–1.0)
Monocytes Relative: 8 %
Neutro Abs: 3.4 10*3/uL (ref 1.7–7.7)
Neutrophils Relative %: 56 %
Platelets: 276 10*3/uL (ref 150–400)
RBC: 4.29 MIL/uL (ref 3.87–5.11)
RDW: 12.3 % (ref 11.5–15.5)
WBC: 6.1 10*3/uL (ref 4.0–10.5)
nRBC: 0 % (ref 0.0–0.2)

## 2023-08-25 LAB — BASIC METABOLIC PANEL
Anion gap: 10 (ref 5–15)
BUN: 10 mg/dL (ref 6–20)
CO2: 27 mmol/L (ref 22–32)
Calcium: 9.8 mg/dL (ref 8.9–10.3)
Chloride: 102 mmol/L (ref 98–111)
Creatinine, Ser: 0.72 mg/dL (ref 0.44–1.00)
GFR, Estimated: >60 mL/min (ref >60)
Glucose, Bld: 123 mg/dL — ABNORMAL HIGH (ref 70–99)
Potassium: 4.3 mmol/L (ref 3.5–5.1)
Sodium: 139 mmol/L (ref 135–145)

## 2023-08-25 NOTE — Discharge Instructions (Signed)
Your workup today was reassuring.  No concerning cause of your symptoms identified.  Given the concern for TIA we recommend that you start 81 mg aspirin daily.  Follow-up closely with your primary care provider for further evaluation.  For any concerning symptoms return to the emergency room.

## 2023-08-25 NOTE — ED Notes (Signed)
 RN reviewed discharge instructions with pt. Pt verbalized understanding and had no further questions. VSS upon discharge.  

## 2023-08-25 NOTE — ED Triage Notes (Signed)
1 episode of left side numbness around noon yesterday. Lasted for about 1 minute. C/o some headaches today but n other symptoms

## 2023-08-25 NOTE — ED Provider Notes (Signed)
[place] EMERGENCY DEPARTMENT AT [facility] Provider Note   CSN: [id number] Arrival date & time: 08/25/23  1244     History  Chief Complaint  Patient presents with   Numbness    Annette Moss is a 47 y.o. female.  47 year old female presents today for concern of left upper extremity and left lower extremity numbness that occurred yesterday and lasted about 1 minute.  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  She states that this was very stress inducing environment.  She denies any speech change, facial droop, vision change.  No weakness.  She denies any chest pain, shortness of breath, or other complaints.  She does have history of migraines but states recently she has not been getting any of the migraine headaches.  The history is provided by the patient. No language interpreter was used.       Home Medications Prior to Admission medications   Medication Sig Start Date End Date Taking? Authorizing Provider  amphetamine-dextroamphetamine (ADDERALL) 10 MG tablet Take 1 tablet by mouth 2 (two) times daily. 07/13/17   [provider]  lisinopril (ZESTRIL) 10 MG tablet Take 10 mg by mouth daily. 07/12/22   [provider]  Semaglutide-Weight Management (WEGOVY) 0.25 MG/0.5ML SOAJ Inject 0.25 mg into the skin once a week. 08/04/23     Semaglutide-Weight Management 0.25 MG/0.5ML SOAJ Inject 0.25 mg into the skin once a week. 08/05/23         Allergies    Sulfamethoxazole-trimethoprim and Penicillins    Review of Systems   Review of Systems  Constitutional:  Negative for activity change, fatigue and fever.  Respiratory:  Negative for shortness of breath.   Cardiovascular:  Negative for chest pain.  Neurological:  Positive for numbness (resolved). Negative for light-headedness and headaches.  All other systems reviewed and are negative.   Physical Exam Updated Vital Signs BP (!) 143/94   Pulse 77   Temp 98.1 F (36.7 C)  (Oral)   Resp 18   LMP 08/22/2023   SpO2 99%  Physical Exam Vitals and nursing note reviewed.  Constitutional:      General: She is not in acute distress.    Appearance: Normal appearance. She is not ill-appearing.  HENT:     Head: Normocephalic and atraumatic.     Nose: Nose normal.  Eyes:     Conjunctiva/sclera: Conjunctivae normal.  Cardiovascular:     Rate and Rhythm: Normal rate and regular rhythm.  Pulmonary:     Effort: Pulmonary effort is normal. No respiratory distress.  Musculoskeletal:        General: No deformity. Normal range of motion.     Cervical back: Normal range of motion.  Skin:    Findings: No rash.  Neurological:     General: No focal deficit present.     Mental Status: She is alert and oriented to person, place, and time. Mental status is at baseline.     Cranial Nerves: No cranial nerve deficit.     Motor: No weakness.     Comments: Cranial nerves III through XII intact.  Full range of motion bilateral upper and lower extremities with 5/5 strength.  Sensation intact and symmetrical.  No facial droop.  No pronator drift.  Pupils equal round reactive to light and accommodating.     ED Results / Procedures / Treatments   Labs (all labs ordered are listed, but only abnormal results are displayed) Labs Reviewed  BASIC METABOLIC  PANEL - Abnormal; Notable for the following components:      Result Value   Glucose, Bld 123 (*)    All other components within normal limits  CBC WITH DIFFERENTIAL/PLATELET    EKG EKG Interpretation Date/Time:  Wednesday August 25 2023 14:17:39 EDT Ventricular Rate:  74 PR Interval:  212 QRS Duration:  91 QT Interval:  411 QTC Calculation: 456 R Axis:   46  Text Interpretation: Sinus rhythm Prolonged PR interval Low voltage, precordial leads Confirmed by Zadie Rhine ([id number]) on 08/25/2023 2:22:00 PM  Radiology CT Head Wo Contrast  Result Date: 08/25/2023 CLINICAL DATA:  Transient ischemic attack. Left-sided  numbness yesterday x1 minute. Headache today. EXAM: CT HEAD WITHOUT CONTRAST TECHNIQUE: Contiguous axial images were obtained from the base of the skull through the vertex without intravenous contrast. RADIATION DOSE REDUCTION: This exam was performed according to the departmental dose-optimization program which includes automated exposure control, adjustment of the mA and/or kV according to patient size and/or use of iterative reconstruction technique. COMPARISON:  Head CT 02/04/2009. FINDINGS: Brain: No acute intracranial hemorrhage. Gray-white differentiation is preserved. No hydrocephalus or extra-axial collection. No mass effect or midline shift. Vascular: No hyperdense vessel or unexpected calcification. Skull: No calvarial fracture or suspicious bone lesion. Skull base is unremarkable. Sinuses/Orbits: No acute finding. Other: None. IMPRESSION: No acute intracranial abnormality. Electronically Signed   By: Orvan Falconer M.D.   On: 08/25/2023 16:02    Procedures Procedures    Medications Ordered in ED Medications - No data to display  ED Course/ Medical Decision Making/ A&P       ABCD2 Score: 3                         Medical Decision Making Amount and/or Complexity of Data Reviewed Labs: ordered. Radiology: ordered.   47 year old female with past medical history significant for hypertension presents today for concern of transient episode of numbness that lasted for 1 minute occurring yesterday.  No associated facial droop, speech change, or new weakness.  This involves only the left upper extremity, and left lower extremity.  ABCD2 score for TIA is 2.  Case discussed with attending who also evaluated patient at bedside.  Does not recommend neurology consultation.  Recommended patient's start 81 mg.  Discussed with patient.  She is agreeable with this.  She will follow-up closely with her PCP.  CBC is unremarkable, BMP shows glucose 123 otherwise without acute concern.  CT head without  contrast shows no acute intracranial finding.  She is appropriate for discharge with close follow-up.  Discharged in stable condition.   Final Clinical Impression(s) / ED Diagnoses Final diagnoses:  Numbness    Rx / DC Orders ED Discharge Orders     None         Marita Kansas, PA-C 08/25/23 1639    Zadie Rhine, MD 08/28/23 [phone]

## 2023-09-22 ENCOUNTER — Other Ambulatory Visit (HOSPITAL_BASED_OUTPATIENT_CLINIC_OR_DEPARTMENT_OTHER): Payer: Self-pay

## 2023-09-22 ENCOUNTER — Other Ambulatory Visit: Payer: Self-pay

## 2023-09-22 MED ORDER — WEGOVY 0.5 MG/0.5ML ~~LOC~~ SOAJ
0.5000 mg | SUBCUTANEOUS | 1 refills | Status: DC
  Filled 2023-09-22: qty 2, 28d supply, fill #0
  Filled 2023-10-19: qty 2, 28d supply, fill #1

## 2023-09-23 ENCOUNTER — Other Ambulatory Visit: Payer: Self-pay

## 2023-09-23 ENCOUNTER — Other Ambulatory Visit (HOSPITAL_BASED_OUTPATIENT_CLINIC_OR_DEPARTMENT_OTHER): Payer: Self-pay

## 2023-09-27 ENCOUNTER — Other Ambulatory Visit: Payer: Self-pay

## 2023-10-12 ENCOUNTER — Encounter (HOSPITAL_BASED_OUTPATIENT_CLINIC_OR_DEPARTMENT_OTHER): Payer: Self-pay

## 2023-10-12 ENCOUNTER — Ambulatory Visit (HOSPITAL_BASED_OUTPATIENT_CLINIC_OR_DEPARTMENT_OTHER): Admit: 2023-10-12 | Payer: BC Managed Care – PPO | Admitting: Obstetrics and Gynecology

## 2023-10-12 SURGERY — HYSTERECTOMY, ABDOMINAL
Anesthesia: General

## 2023-10-17 IMAGING — CT CT ABD-PELV W/ CM
1 of 3 series · 13 of 32 positions shown, 19 images · IV contrast (APPLIED)
Comparison: None.

CLINICAL DATA: Concern for ventral hernia/lipoma.

EXAM:
CT ABDOMEN AND PELVIS WITH CONTRAST
TECHNIQUE: Multidetector CT imaging of the abdomen and pelvis was performed
using the standard protocol following bolus administration of
intravenous contrast.
CONTRAST:  100mL [id number] IOPAMIDOL ([id number]) INJECTION 61%

[Series 2: abd/pelvis w/cm · axial · 0.97mm/px · z∈[-671,-186]mm · 13 of 115 slices shown, 19 images]
[im 9/115  soft-tissue]
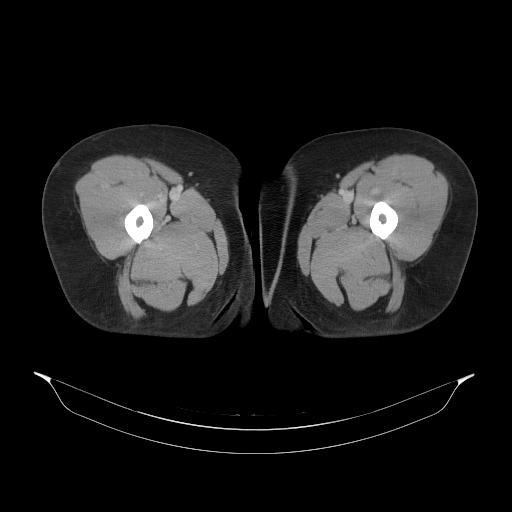
[im 9/115  bone]
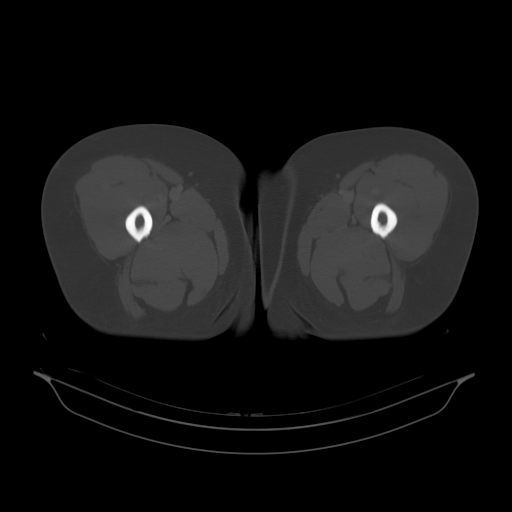
[im 17/115  soft-tissue]
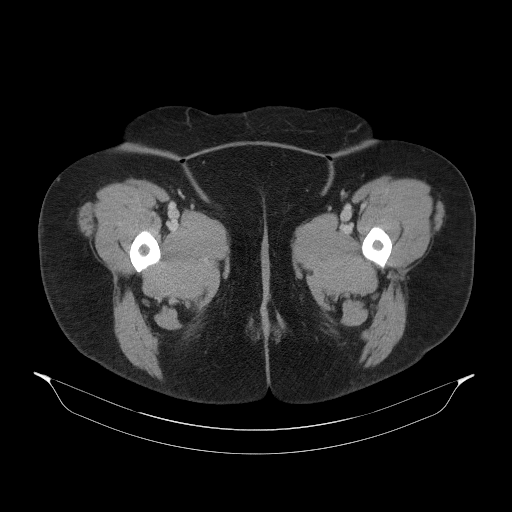
[im 25/115  soft-tissue]
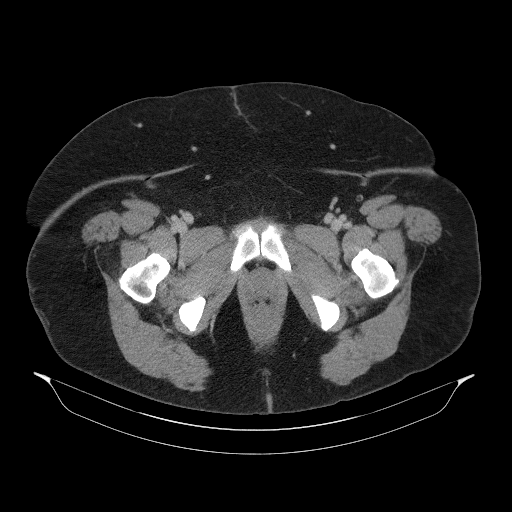
[im 33/115  soft-tissue]
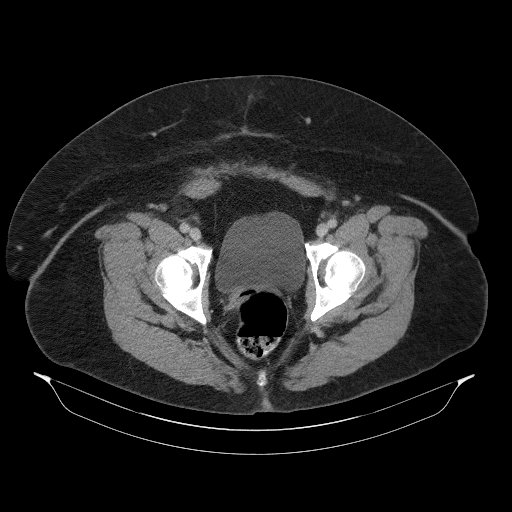
[im 41/115  soft-tissue]
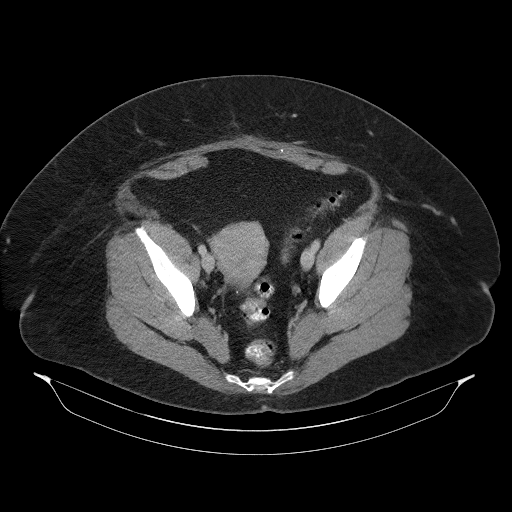
[im 49/115  soft-tissue]
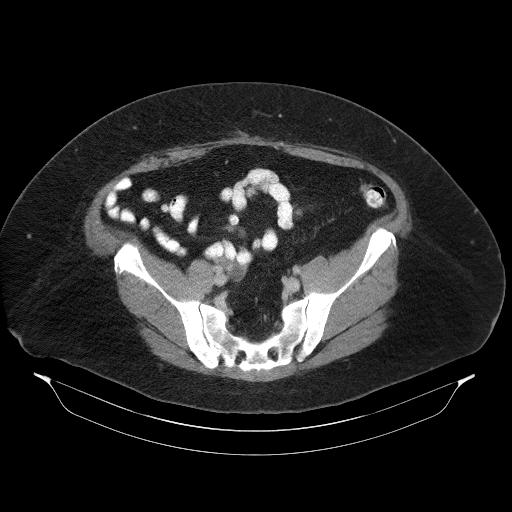
[im 58/115  soft-tissue]
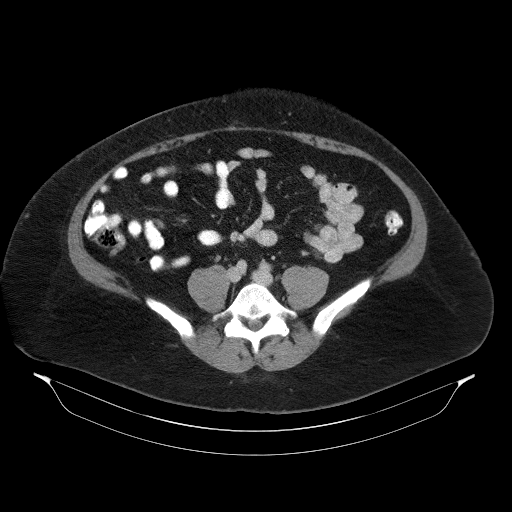
[im 66/115  soft-tissue]
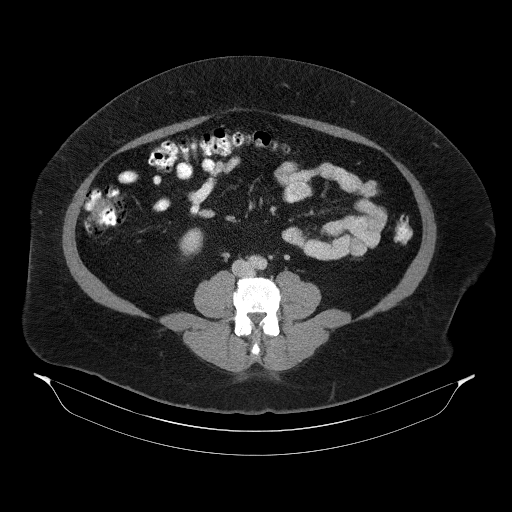
[im 74/115  soft-tissue]
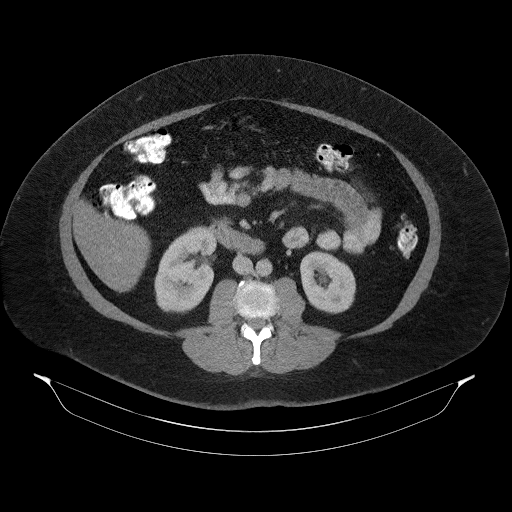
[im 74/115  bone]
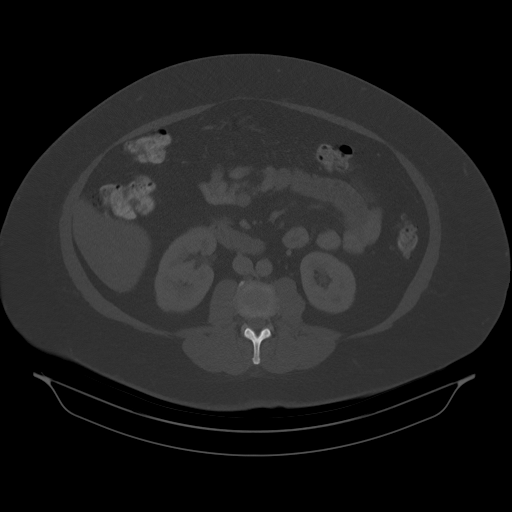
[im 82/115  soft-tissue]
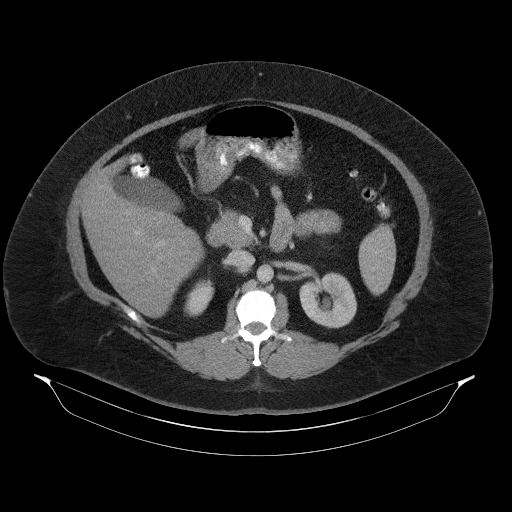
[im 82/115  lung]
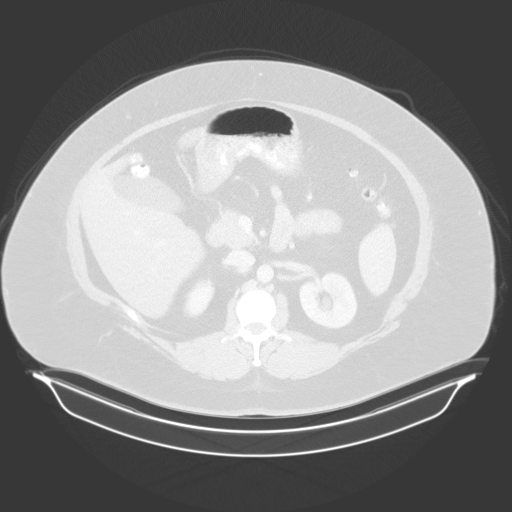
[im 90/115  soft-tissue]
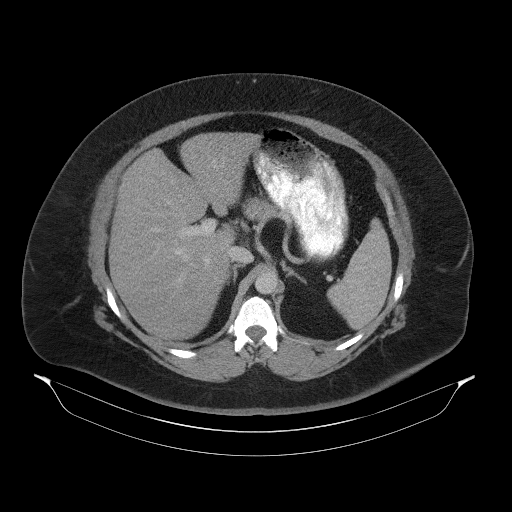
[im 90/115  lung]
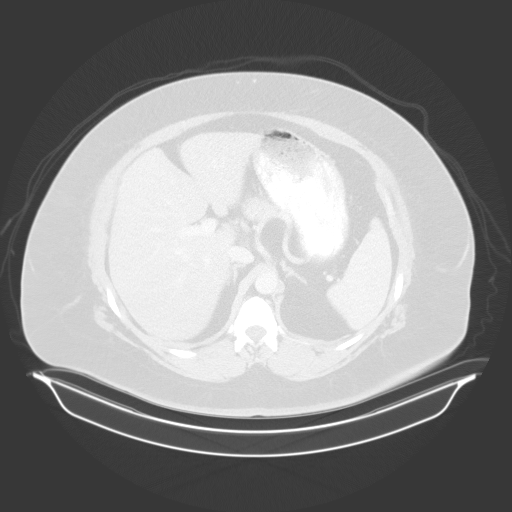
[im 98/115  soft-tissue]
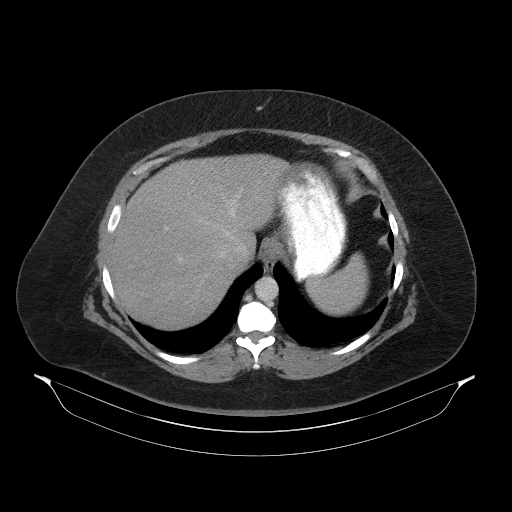
[im 98/115  lung]
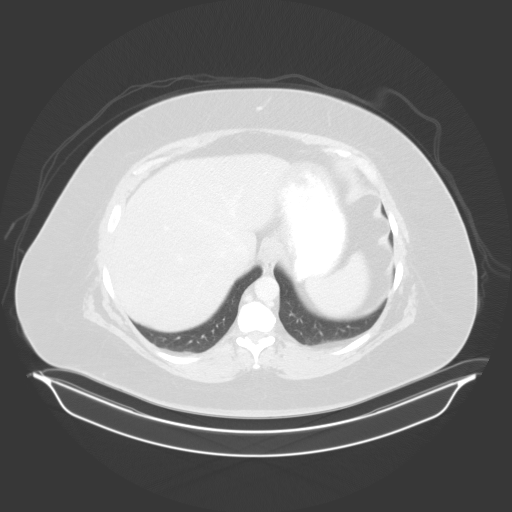
[im 106/115  soft-tissue]
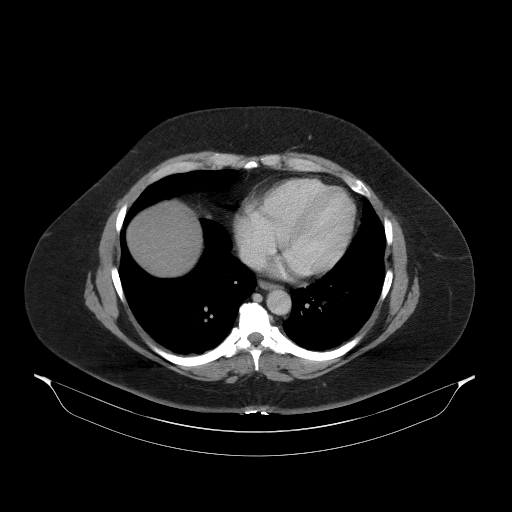
[im 106/115  lung]
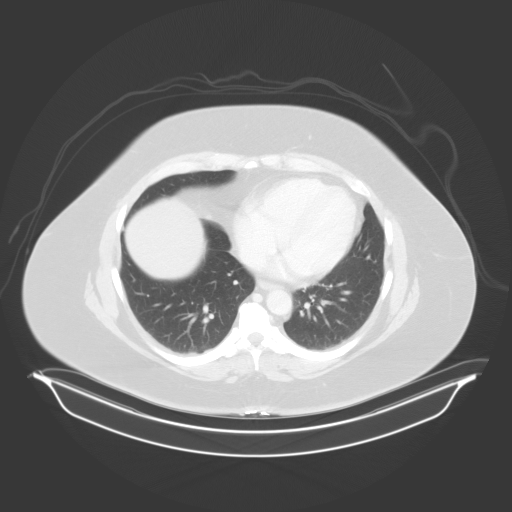

[13 of 32 positions shown; findings below may reference images not displayed]

FINDINGS: Lower chest: No acute abnormality.

Hepatobiliary: Hepatomegaly with hepatic steatosis. No suspicious
hepatic lesion. Gallbladder is unremarkable. No biliary ductal
dilation.

Pancreas: No pancreatic ductal dilation or evidence of acute
inflammation.

Spleen: Within normal limits.

Adrenals/Urinary Tract: Bilateral adrenal glands are unremarkable.
No hydronephrosis. Symmetric enhancement and excretion of contrast
from bilateral kidneys. No solid enhancing renal mass. Urinary
bladder is unremarkable for degree of distension.

Stomach/Bowel: Radiopaque enteric contrast material traverses the
rectum. Small hiatal hernia with retained/refluxed fluid in the
distal esophagus. Stomach is moderately distended without wall
thickening. No pathologic dilation of small or large bowel. The
appendix and terminal ileum appear normal. Colonic diverticulosis
without findings of acute diverticulitis. No suspicious colonic wall
thickening or mass like lesions identified.

Vascular/Lymphatic: No significant vascular finding. No
pathologically enlarged abdominal or pelvic lymph nodes.

Reproductive: Uterus is unremarkable.  No suspicious adnexal mass.

Other: Postsurgical change in the anterior abdominal wall. Small fat
containing ventral abdominal hernia.

Musculoskeletal: No acute or significant osseous findings.
IMPRESSION: 1. Small fat containing ventral abdominal hernia.
2. Small hiatal hernia with retained/refluxed fluid in the distal
esophagus.
3. Hepatomegaly with hepatic steatosis.
4. Colonic diverticulosis without findings of acute diverticulitis.

## 2023-11-02 ENCOUNTER — Other Ambulatory Visit (HOSPITAL_BASED_OUTPATIENT_CLINIC_OR_DEPARTMENT_OTHER): Payer: Self-pay

## 2023-11-03 ENCOUNTER — Other Ambulatory Visit (HOSPITAL_BASED_OUTPATIENT_CLINIC_OR_DEPARTMENT_OTHER): Payer: Self-pay

## 2023-11-03 MED ORDER — WEGOVY 1 MG/0.5ML ~~LOC~~ SOAJ
1.0000 mg | SUBCUTANEOUS | 1 refills | Status: DC
  Filled 2023-11-03 (×2): qty 2, 28d supply, fill #0
  Filled 2023-11-26: qty 2, 28d supply, fill #1

## 2023-11-04 ENCOUNTER — Other Ambulatory Visit (HOSPITAL_BASED_OUTPATIENT_CLINIC_OR_DEPARTMENT_OTHER): Payer: Self-pay

## 2023-12-21 ENCOUNTER — Other Ambulatory Visit (HOSPITAL_BASED_OUTPATIENT_CLINIC_OR_DEPARTMENT_OTHER): Payer: Self-pay

## 2023-12-21 MED ORDER — WEGOVY 1.7 MG/0.75ML ~~LOC~~ SOAJ
1.7000 mg | SUBCUTANEOUS | 1 refills | Status: DC
  Filled 2023-12-21: qty 3, 28d supply, fill #0
  Filled 2024-01-24: qty 3, 28d supply, fill #1

## 2024-02-22 ENCOUNTER — Other Ambulatory Visit (HOSPITAL_BASED_OUTPATIENT_CLINIC_OR_DEPARTMENT_OTHER): Payer: Self-pay

## 2024-02-22 MED ORDER — WEGOVY 2.4 MG/0.75ML ~~LOC~~ SOAJ
2.4000 mg | SUBCUTANEOUS | 3 refills | Status: AC
  Filled 2024-02-22: qty 3, 28d supply, fill #0
  Filled 2024-03-13 – 2024-03-16 (×3): qty 3, 28d supply, fill #1
  Filled 2024-04-17: qty 3, 28d supply, fill #2

## 2024-02-23 ENCOUNTER — Other Ambulatory Visit (HOSPITAL_BASED_OUTPATIENT_CLINIC_OR_DEPARTMENT_OTHER): Payer: Self-pay

## 2024-02-24 ENCOUNTER — Other Ambulatory Visit (HOSPITAL_BASED_OUTPATIENT_CLINIC_OR_DEPARTMENT_OTHER): Payer: Self-pay

## 2024-03-02 ENCOUNTER — Other Ambulatory Visit (HOSPITAL_BASED_OUTPATIENT_CLINIC_OR_DEPARTMENT_OTHER): Payer: Self-pay

## 2024-03-13 ENCOUNTER — Other Ambulatory Visit (HOSPITAL_BASED_OUTPATIENT_CLINIC_OR_DEPARTMENT_OTHER): Payer: Self-pay

## 2024-03-15 ENCOUNTER — Other Ambulatory Visit (HOSPITAL_BASED_OUTPATIENT_CLINIC_OR_DEPARTMENT_OTHER): Payer: Self-pay

## 2025-01-04 ENCOUNTER — Other Ambulatory Visit: Payer: Self-pay

## 2025-01-04 ENCOUNTER — Ambulatory Visit

## 2025-01-04 VITALS — Ht 63.0 in | Wt 206.0 lb

## 2025-01-04 DIAGNOSIS — I10 Essential (primary) hypertension: Secondary | ICD-10-CM | POA: Insufficient documentation

## 2025-01-04 DIAGNOSIS — Z1211 Encounter for screening for malignant neoplasm of colon: Secondary | ICD-10-CM

## 2025-01-04 MED ORDER — NA SULFATE-K SULFATE-MG SULF 17.5-3.13-1.6 GM/177ML PO SOLN: 1.0000 | Freq: Once | ORAL | 0 refills | Status: AC

## 2025-01-04 NOTE — Progress Notes (Signed)

## 2025-01-05 ENCOUNTER — Encounter: Payer: Self-pay | Admitting: Internal Medicine

## 2025-01-18 ENCOUNTER — Encounter: Admitting: Internal Medicine
